# Patient Record
Sex: Male | Born: 1995 | Race: Black or African American | Hispanic: No | Marital: Single
Health system: Southern US, Community
[De-identification: ages and names within clinical notes are randomized; demographics above are authoritative.]

---

## 2018-08-24 ENCOUNTER — Other Ambulatory Visit: Payer: Self-pay

## 2018-08-24 ENCOUNTER — Emergency Department
Admission: EM | Admit: 2018-08-24 | Discharge: 2018-08-24 | Disposition: A | Discharge status: Home / Self Care | Payer: No Typology Code available for payment source | Attending: Emergency Medicine | Admitting: Emergency Medicine

## 2018-08-24 ENCOUNTER — Emergency Department: Payer: No Typology Code available for payment source

## 2018-08-24 DIAGNOSIS — Y92411 Interstate highway as the place of occurrence of the external cause: Secondary | ICD-10-CM | POA: Insufficient documentation

## 2018-08-24 DIAGNOSIS — Y999 Unspecified external cause status: Secondary | ICD-10-CM | POA: Diagnosis not present

## 2018-08-24 DIAGNOSIS — S8991XA Unspecified injury of right lower leg, initial encounter: Secondary | ICD-10-CM | POA: Diagnosis present

## 2018-08-24 DIAGNOSIS — S8391XA Sprain of unspecified site of right knee, initial encounter: Secondary | ICD-10-CM | POA: Diagnosis not present

## 2018-08-24 DIAGNOSIS — Y9389 Activity, other specified: Secondary | ICD-10-CM | POA: Diagnosis not present

## 2018-08-24 DIAGNOSIS — S8001XA Contusion of right knee, initial encounter: Secondary | ICD-10-CM

## 2018-08-24 MED ORDER — CYCLOBENZAPRINE HCL 10 MG PO TABS: 10.0000 mg | ORAL_TABLET | Freq: Three times a day (TID) | ORAL | 0 refills | Status: AC | PRN

## 2018-08-24 MED ORDER — OXYCODONE-ACETAMINOPHEN 5-325 MG PO TABS
2.0000 | ORAL_TABLET | Freq: Once | ORAL | Status: AC
  Administered 2018-08-24: 2 via ORAL
  Filled 2018-08-24: qty 2

## 2018-08-24 MED ORDER — IBUPROFEN 800 MG PO TABS: 800.0000 mg | ORAL_TABLET | Freq: Three times a day (TID) | ORAL | 0 refills | Status: AC | PRN

## 2018-08-24 NOTE — ED Provider Notes (Addendum)
I attempted to see the patient however he would not get off his cell phone even though he saw me in the room and saw me as I addressed myself as the physician.  I will allow him to wait until morning shift to be seen.   Merrily Brittle, MD 08/24/18 [id number]    Merrily Brittle, MD 08/24/18 [phone]

## 2018-08-24 NOTE — ED Notes (Addendum)
Patient ambulated to his door, opened door and asked where bathroom was. Patient informed bathroom was in room. Patient closed door and used restroom. Patient ambulated back to door requested pain medication. Patient informed that no medicine can be given without MD order. Patient further informed that he would not be seen by night shift MD, but rather by day shift MD, as he would not get off of his cellular device and speak to MD when the MD entered to room and introduced himself. Patient asking to speak to manager. Charge RN informed. Patient instructed to wait in room for Charge RN. Patient reported that he could not walk. Patient reminded that he had ambulated to the door. Patient again said that he could not walk. Bed rolled to patient. Patient rolled into room.

## 2018-08-24 NOTE — ED Notes (Signed)
Patient transported to X-ray 

## 2018-08-24 NOTE — ED Notes (Signed)
Consulting civil engineer and this RN to bedside. Patient hit code button as charge RN and this RN were entering the room. Patient instructed that he was not to hit the code blue button. Charge RN reinforced and explained that no medications could be given without a MD order, and that patient would be seeing day shift MD. Patient verbalized understanding.

## 2018-08-24 NOTE — ED Notes (Signed)
ED Provider at bedside. 
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
